# Patient Record
Sex: Male | Born: 1995 | Race: White | Hispanic: No
Health system: Midwestern US, Community
[De-identification: ages and names within clinical notes are randomized; demographics above are authoritative.]

---

## 2014-07-16 IMAGING — CT CT NECK SOFT TISSUE W CONTRAST
3 of 4 series · 16 of 33 positions shown, 19 images · IV contrast (agent unspecified)
Comparison: 11/01/2013.

CT SCAN NECK WITH CONTRAST
HISTORY: Neck mass.
TECHNIQUE: CT of the neck with contrast, 80 cc Optiray 320.

[Series 3: soft tissue · axial · 0.46mm/px · z∈[-276,-84]mm · 8 of 80 slices shown, 10 images]
[im 8/80  soft-tissue]
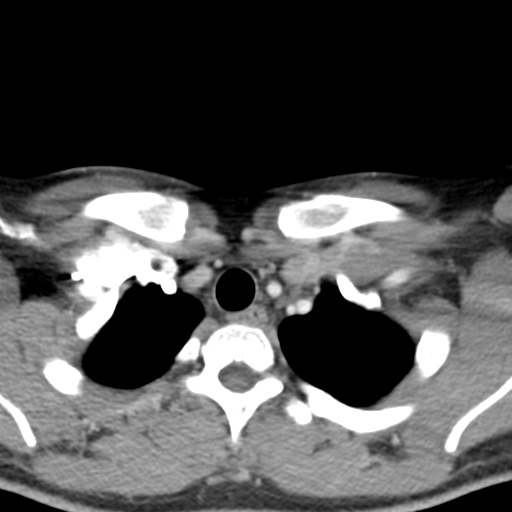
[im 8/80  bone]
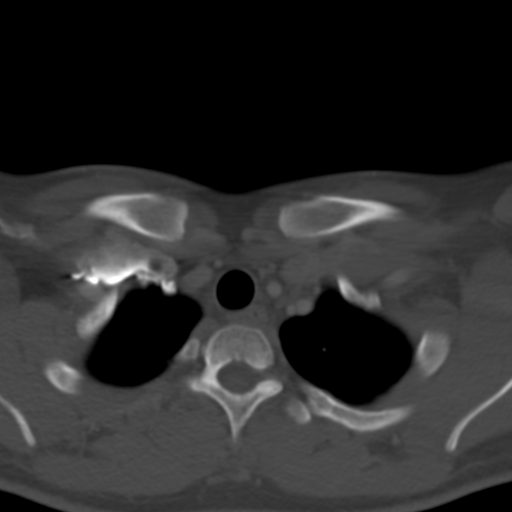
[im 16/80  bone]
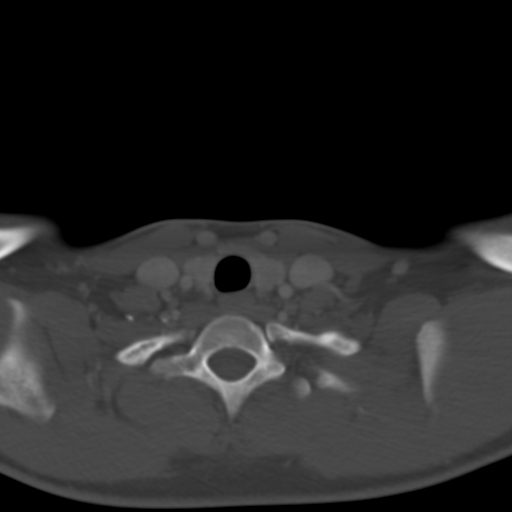
[im 24/80  bone]
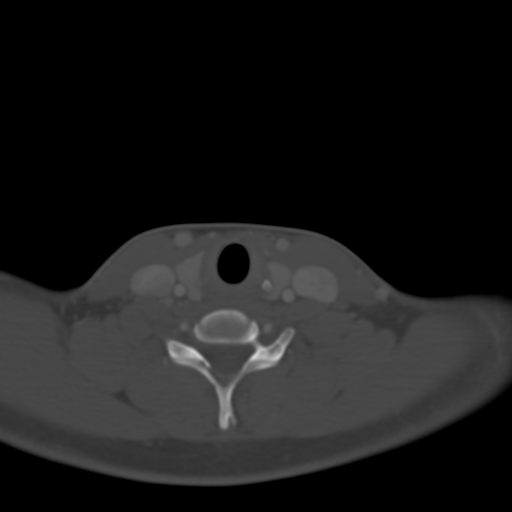
[im 32/80  bone]
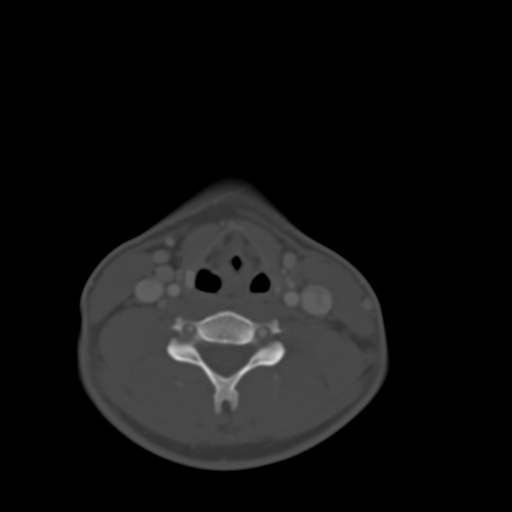
[im 48/80  soft-tissue]
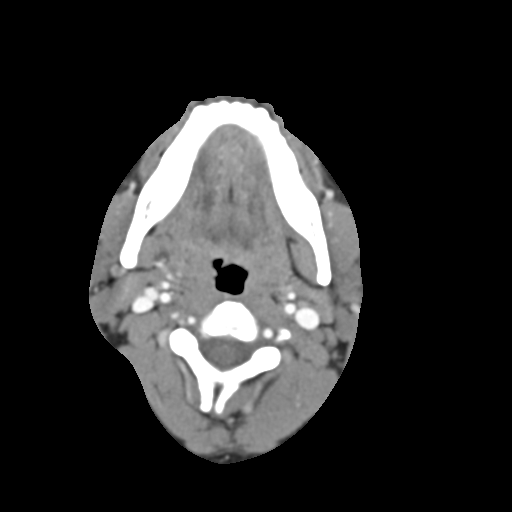
[im 48/80  bone]
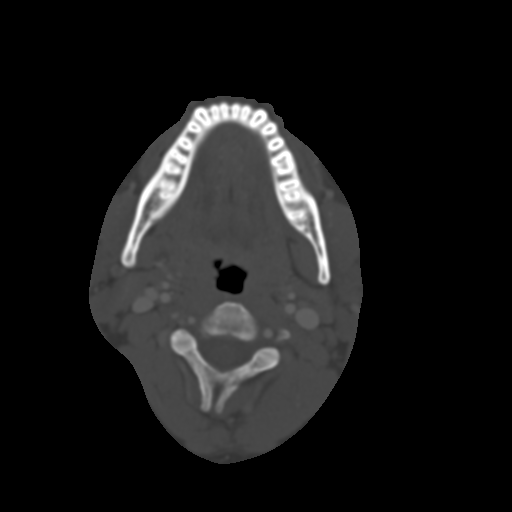
[im 56/80  bone]
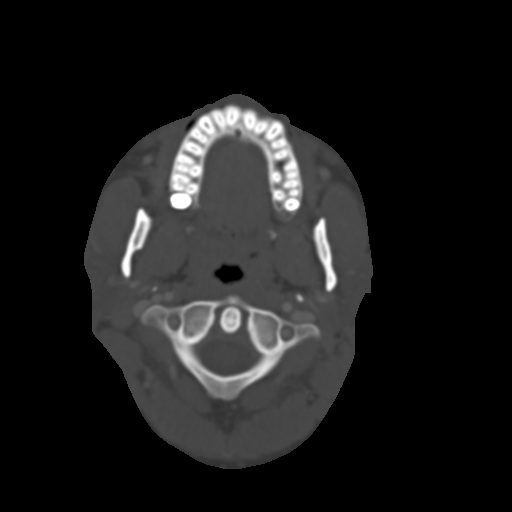
[im 64/80  bone]
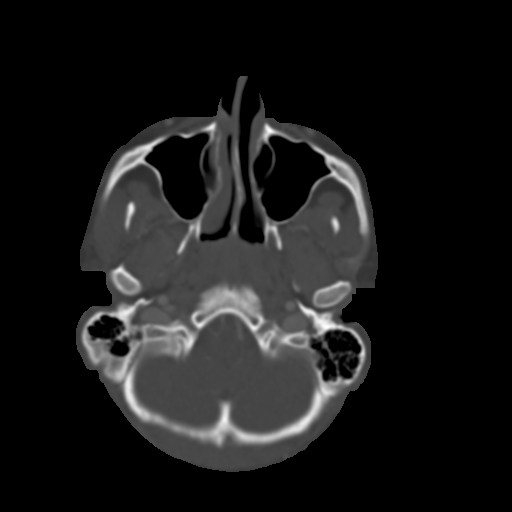
[im 72/80  bone]
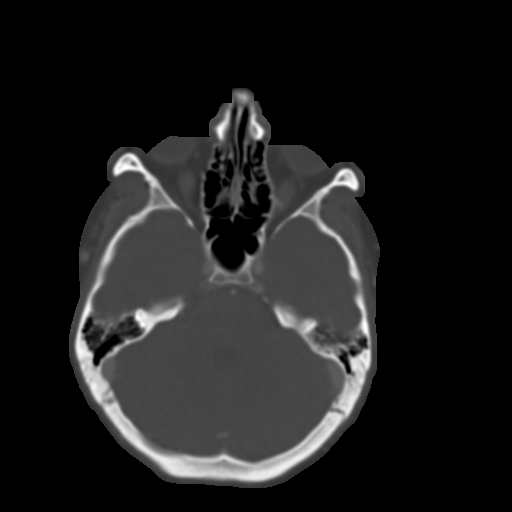

[Series 5: coronal · coronal · 0.50mm/px · 3 of 68 slices shown]
[im 14/68  bone]
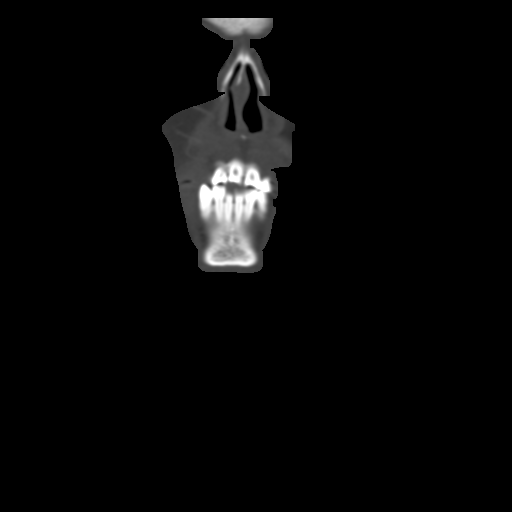
[im 27/68  bone]
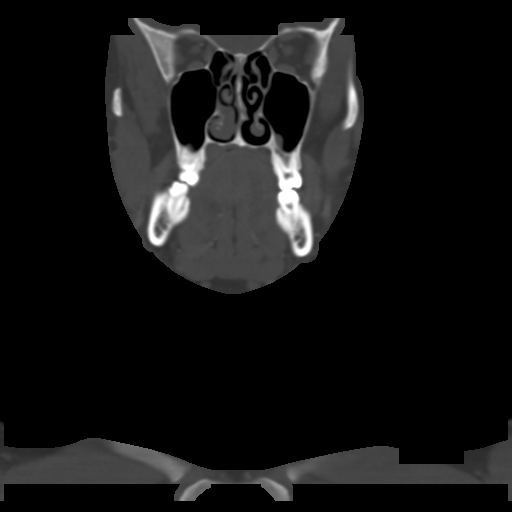
[im 41/68  bone]
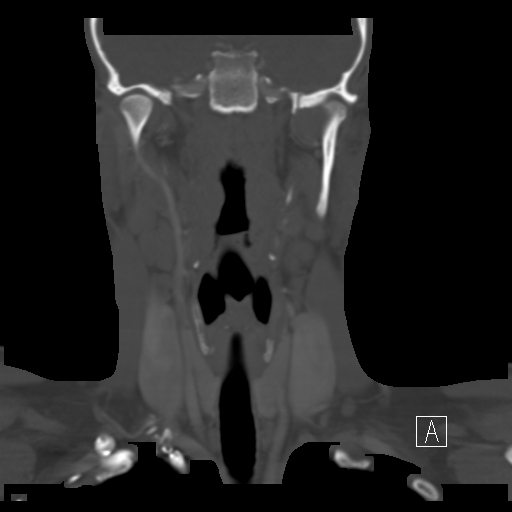

[Series 6: sagittal · sagittal · 0.50mm/px · 5 of 71 slices shown, 6 images]
[im 24/71  bone]
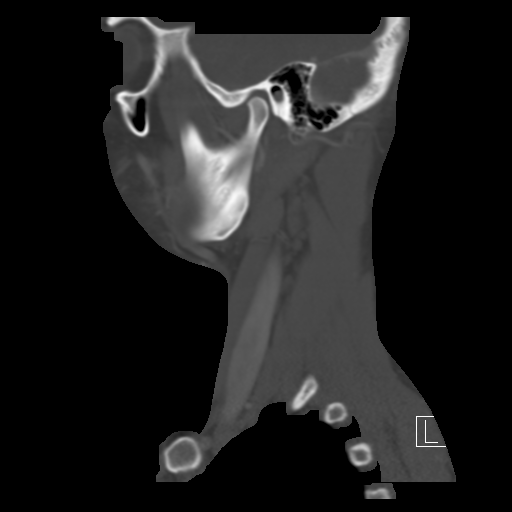
[im 30/71  bone]
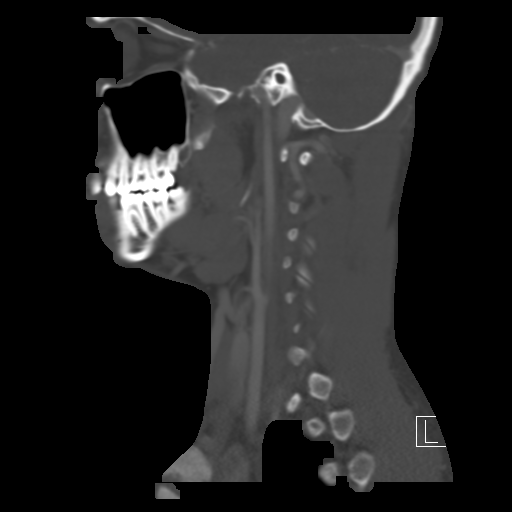
[im 36/71  soft-tissue]
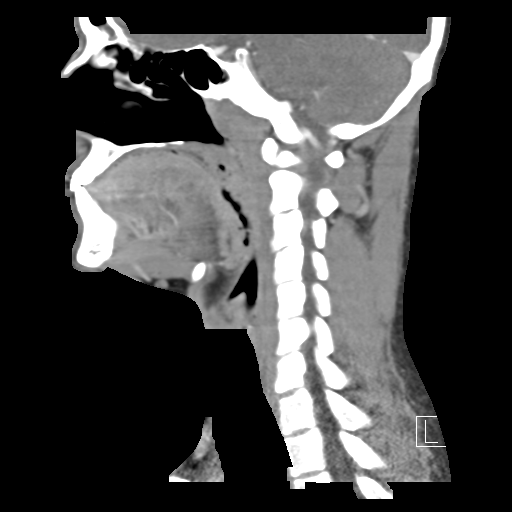
[im 36/71  bone]
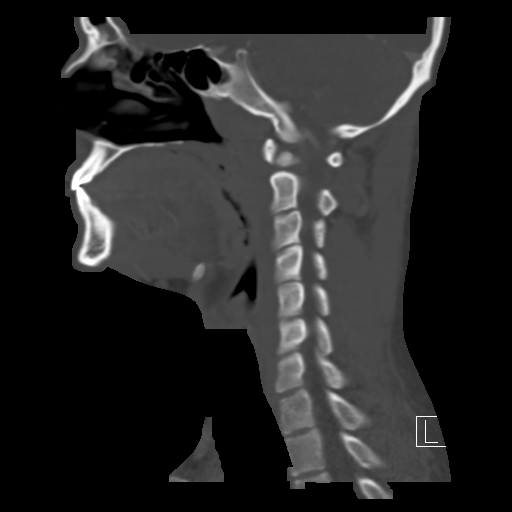
[im 41/71  bone]
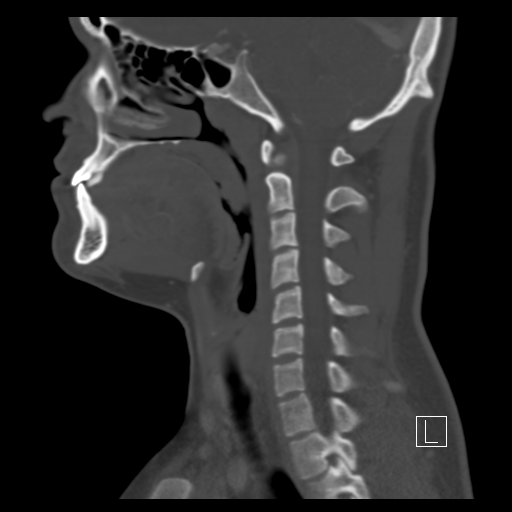
[im 47/71  bone]
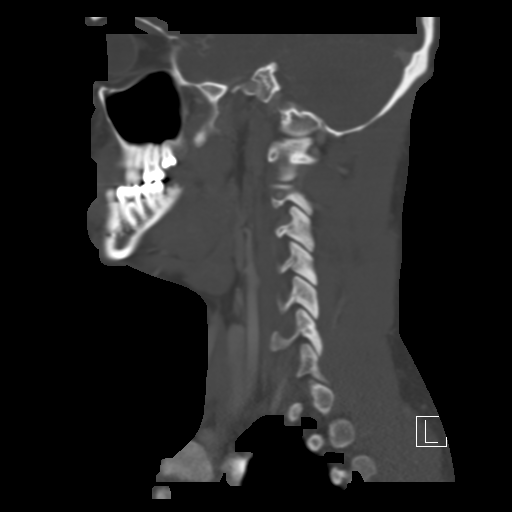

[16 of 33 positions shown; findings below may reference images not displayed]

FINDINGS: Multiple borderline enlarged lymph nodes are seen bilaterally which may not be abnormal in this young patient.

The oropharynx, nasopharynx, hypopharynx, and the laryngeal structures are within normal limits.  There is a 3.3 x 2.8 x 3.1 cm heterogeneous mass in the right submandibular region which is not definitely separate from the right submandibular gland. The thyroid gland, left submandibular gland and parotid glands are unremarkable.

The great vessels of the neck are within normal limits.

The visualized paranasal sinuses are clear.

The cervical vertebral bodies demonstrate no gross abnormalities.
IMPRESSION: Right submandibular mass is nonspecific. Infection or neoplasm (primary submandibular gland versus metastatic versus lymphoma) are considerations.

## 2020-02-28 NOTE — ED Notes (Signed)
ED Triage Note       ED Secondary Triage Entered On:  02/28/2020 14:06 EDT    Performed On:  02/28/2020 14:05 EDT by Leitha Bleak, RN, Lavonne Chick               General Information   Barriers to Learning :   None evident   Languages :   English   COVID-19 Vaccine Status :   1 Dose received   1 Dose Received Date :   02/21/2020 EDT   1 Dose Received Manufacturer :   Pfizer vaccine   ED Home Meds Section :   Document assessment   [facility] ED Fall Risk Section :   Document assessment   ED History Section :   Document assessment   ED Advance Directives Section :   Document assessment   ED Palliative Screen :   N/A (prefilled for <65yo)   Leitha Bleak, RN, Lavonne Chick - 02/28/2020 14:05 EDT   (As Of: 02/28/2020 14:06:40 EDT)   Problems(Active)    Anxiety (SNOMED CT  :45409811 )  Name of Problem:   Anxiety ; Recorder:   Ace, RN, Anderson Malta; Confirmation:   Confirmed ; Classification:   Patient Stated ; Code:   [id number] ; Contributor System:   PowerChart ; Last Updated:   02/28/2020 13:41 EDT ; Life Cycle Date:   02/28/2020 ; Life Cycle Status:   Active ; Vocabulary:   SNOMED CT          Diagnoses(Active)    Psychiatric problem  Date:   02/28/2020 ; Diagnosis Type:   Reason For Visit ; Confirmation:   Complaint of ; Clinical Dx:   Psychiatric problem ; Classification:   Medical ; Clinical Service:   Emergency medicine ; Code:   PNED ; Probability:   0 ; Diagnosis Code:   [id number]             -    Procedure History   (As Of: 02/28/2020 14:06:40 EDT)     [facility] Fall Risk Assessment Tool   Hx of falling last 3 months ED Fall :   No   Patient confused or disoriented ED Fall :   No   Patient intoxicated or sedated ED Fall :   No   Patient impaired gait ED Fall :   No   Use a mobility assistance device ED Fall :   No   Patient altered elimination ED Fall :   No   [facility] ED Fall Score :   0    Leitha Bleak RNLavonne Chick - 02/28/2020 14:05 EDT   ED Advance Directive   Advance Directive :   No   Ewers, RN, Lavonne Chick - 02/28/2020 14:05 EDT    Social History   Social History   (As Of: 02/28/2020 14:06:40 EDT)   Tobacco:        Tobacco use: Never (less than 100 in lifetime).   (Last Updated: 02/28/2020 14:06:12 EDT by Leitha Bleak, RN, Lavonne Chick)          Alcohol:        Current, 1-2 times per month   (Last Updated: 02/28/2020 14:06:17 EDT by Leitha Bleak, RN, Lavonne Chick)          Substance Use:        Denies   (Last Updated: 02/28/2020 14:06:26 EDT by Leitha Bleak, RN, Lavonne Chick)            Med Hx   Medication List   (As Of: 02/28/2020  14:06:40 EDT)   No Known Home Medications     Leitha Bleak, RN, Lavonne Chick - 02/28/2020 14:06:02
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
## 2020-02-28 NOTE — ED Notes (Signed)
 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, works from home, Secondary school teacher Pain Scale :   0 = No pain   [place] Mode of Arrival :   Private vehicle   Infectious Disease Documentation :   Document assessment   Temperature Oral :   37.1 degC(Converted to: 98.8 degF)    Heart Rate Monitored :   94 bpm   Respiratory Rate :   16 br/min   Systolic Blood Pressure :   154 mmHg (HI)    Diastolic Blood Pressure :   107 mmHg (>HHI)    SpO2 :   97 %   Oxygen Therapy :   Room air   Patient presentation :   None of the above   Chief Complaint or Presentation suggest infection :   No   Weight Dosing :   80.1 kg(Converted to: 176 lb 9 oz)    Height :   168 cm(Converted to: 5 ft 6 in)    Body Mass Index Dosing :   28 kg/m2   Ace, RN, Harlene HERO - 02/28/2020 13:39 EDT   DCP GENERIC CODE   Tracking Acuity :   2   Tracking Group :   ED [facility]   Ace, RN, Harlene HERO - 02/28/2020 13:39 EDT   ED General Section :   Document assessment   Pregnancy Status :   N/A   ED Allergies Section :   Document assessment   ED Reason for Visit Section :   Document assessment   Ace, RN, Harlene HERO - 02/28/2020 13:39 EDT   ID Risk Screen Symptoms   Recent Travel History :   No recent travel   Close Contact with COVID-19 ID :   No   Last 14 days COVID-19 ID :   No   TB Symptom Screen :   No symptoms   C. diff Symptom/History ID :   Neither of the above   Ace, RN, Harlene HERO - 02/28/2020 13:39 EDT   Allergies   (As Of: 02/28/2020 13:45:07 EDT)   Allergies (Active)   penicillins  Estimated Onset Date:   Unspecified ; Reactions:   throat swelling ; Created By:   Vona RN, Harlene HERO; Reaction Status:   Active ;  Category:   Drug ; Substance:   penicillins ; Type:   Allergy ; Severity:   Severe ; Updated By:   Vona RN, Harlene HERO; Reviewed Date:   02/28/2020 13:44 EDT      sulfa drugs  Estimated Onset Date:   Unspecified ; Reactions:   throat swelling ; Created By:   Vona RN, Harlene HERO; Reaction Status:   Active ; Category:   Drug ; Substance:   sulfa drugs ; Type:   Allergy ; Severity:   Severe ; Updated By:   Vona OBIE Harlene HERO; Reviewed Date:   02/28/2020 13:44 EDT        Psycho-Social  Last 3 mo, thoughts killing self/others :   Yes   Right click within box for Suspected Abuse policy link. :   None   Feels Safe Where Live :   Yes   ED Behavioral Activity Rating Scale :   4 - Quiet and awake (normal level of activity)   Ace, RN, Harlene HERO - 02/28/2020 13:39 EDT   ED Reason for Visit   (As Of: 02/28/2020 13:45:07 EDT)   Problems(Active)    Anxiety (SNOMED CT  :18866980 )  Name of Problem:   Anxiety ; Recorder:   Ace, RN, Harlene HERO; Confirmation:   Confirmed ; Classification:   Patient Stated ; Code:   [id number] ; Contributor System:   PowerChart ; Last Updated:   02/28/2020 13:41 EDT ; Life Cycle Date:   02/28/2020 ; Life Cycle Status:   Active ; Vocabulary:   SNOMED CT          Diagnoses(Active)    Psychiatric problem  Date:   02/28/2020 ; Diagnosis Type:   Reason For Visit ; Confirmation:   Complaint of ; Clinical Dx:   Psychiatric problem ; Classification:   Medical ; Clinical Service:   Emergency medicine ; Code:   PNED ; Probability:   0 ; Diagnosis Code:   [id number]        CSSRS Screen   CSSRS Screen Wish to be Dead :   Past month, yes   CSSRS Screen Suicidal Thoughts :   Past month, yes   CSSRS Screen Suicide Behavior :   Lifetime, no   Ace, RN, Harlene HERO - 02/28/2020 13:39 EDT

## 2020-02-28 NOTE — ED Notes (Signed)
ED Note-Nursing       ED RN Reassessment Entered On:  02/28/2020 15:58 EDT    Performed On:  02/28/2020 15:57 EDT by Leitha Bleak, RN, Lavonne Chick               ED RN Reassessment   ED RN Progress Note :   [facility] @ 45m they will send link via email and do a virtual visit.    Leitha Bleak, RN, Lavonne Chick - 02/28/2020 15:57 EDT

## 2020-02-28 NOTE — ED Notes (Signed)
ED Patient Education Note     Patient Education Materials Follows:  Behavioral Health     Suicidal Feelings: How to Help Yourself    Suicide is the taking of one's own life. If you feel as though life is getting too tough to handle and are thinking about suicide, get help right away. To get help:     Call your local emergency services (911 in the [place]     Call a suicide hotline to speak with a trained counselor who understands how you are feeling. The following is a list of suicide hotlines in the [place]. For a list of hotlines in [place], visit FindSkins.pl.    ? ?1-800-273-TALK 305-713-7398).    ? ?1-800-SUICIDE 3160394684).    ? ?720-746-5316. This is a hotline for Spanish speakers.    ? ?1-800-799-4TTY 813-033-8091). This is a hotline for TTY users.    ? ?1-866-4-U-TREVOR 251-416-9864). This is a hotline for lesbian, gay, bisexual, transgender, or questioning youth.     Contact a crisis center or a local suicide prevention center. To find a crisis center or suicide prevention center:    ? Call your local hospital, clinic, community service organization, mental health center, social service provider, or health department. Ask for assistance in connecting to a crisis center.    ? Visit BankingRep.com.au for a list of crisis centers in the [place], or visit www.suicideprevention.ca/thinking-about-suicide/find-a-crisis-centre for a list of centers in [place].     Visit the following websites:    ? ?National Suicide Prevention Lifeline: www.suicidepreventionlifeline.org    ? ?Hopeline: www.hopeline.com    ? ?[employer] for Suicide Prevention: PromotionalLoans.co.za    ? ?[employer] (for lesbian, gay, bisexual, transgender, or questioning youth): www.thetrevorproject.org    HOW CAN I HELP MYSELF FEEL BETTER?     Promise yourself that you will not do anything drastic when you have suicidal  feelings. Remember, there is hope. Many people have gotten through suicidal thoughts and feelings, and you will, too. You may have gotten through them before, and this proves that you can get through them again.     Let family, friends, teachers, or counselors know how you are feeling. Try not to isolate yourself from those who care about you. Remember, they will want to help you. Talk with someone every day, even if you do not feel sociable. Face-to-face conversation is best.     Call a mental health professional and see one regularly.     Visit your primary health care provider every year.     Eat a well-balanced diet, and space your meals so you eat regularly.     Get plenty of rest.     Avoid alcohol and drugs, and remove them from your home. They will only make you feel worse.     If you are thinking of taking a lot of medicine, give your medicine to someone who can give it to you one day at a time. If you are on antidepressants and are concerned you will overdose, let your health care provider know so he or she can give you safer medicines. Ask your mental health professional about the possible side effects of any medicines you are taking.     Remove weapons, poisons, knives, and anything else that could harm you from your home.     Try to stick to routines. Follow a schedule every day. Put self-care on your schedule.     Make a list of realistic goals, and cross  them off when you achieve them. Accomplishments give a sense of worth.     Wait until you are feeling better before doing the things you find difficult or unpleasant.     Exercise if you are able. You will feel better if you exercise for even a half hour each day.     Go out in the sun or into nature. This will help you recover from depression faster. If you have a favorite place to walk, go there.     Do the things that have always given you pleasure. Play your favorite music, read a good book, paint a picture, play your favorite instrument, or do  anything else that takes your mind off your depression if it is safe to do.     Keep your living space well lit.     When you are feeling well, write yourself a letter about tips and support that you can read when you are not feeling well.     Remember that life's difficulties can be sorted out with help. Conditions can be treated. You can work on thoughts and strategies that serve you well.    This information is not intended to replace advice given to you by your health care provider. Make sure you discuss any questions you have with your health care provider.    Document Released: 03/27/2003 Document Revised: 10/11/2014 Document Reviewed: 01/15/2014  Elsevier Interactive Patient Education ?2016 [employer].

## 2020-02-28 NOTE — ED Notes (Signed)
ED Patient Summary       ;          [facility]  [address], [place], [place] [address]  [phone]  Discharge Instructions (Patient)  _______________________________________     Alex King  DOB:  [birth year]                   MRN: [id number]                   FIN: [id number]  Reason For Visit: Psychiatric problem; SI  Final Diagnosis: Suicidal ideation     Visit Date: 02/28/2020 13:29:00  Address:   Phone: [phone]     Emergency Department Providers:         Primary Physician:   Debbe Odea         [facility] would like to thank you for allowing [place] to assist you with your healthcare needs. The following includes patient education materials and information regarding your injury/illness.     Follow-up Instructions:  You were seen today on an emergency basis. Please contact your primary care doctor for a follow up appointment. If you received a referral to a specialist doctor, it is important you follow-up as instructed.    It is important that you call your follow-up doctor to schedule and confirm the location of your next appointment. Your doctor may practice at multiple locations. The office location of your follow-up appointment may be different to the one written on your discharge instructions.    If you do not have a primary care doctor, please call [phone] for help in finding a [facility] Provider. For help in finding a specialist doctor, please call [phone].    The [facility] "Ask a Nurse" line in staffed by Registered Nurses and is a free service to the community. We are available Monday - Friday from 8am to 5pm to answer your questions about your health. Please call [phone].    If your condition gets worse before your follow-up with your primary care doctor or specialist, please return to the Emergency Department.        Coronavirus 2019 (COVID-19) Reminders:     Patients aged 33 and older,  people with increased risk for severe COVID-19 disease, or frontline workers with increased occupational risk can make an appointment for a COVID-19 vaccine. Patients can contact their [facility] doctors' offices to schedule an appointment to receive the COVID-19 vaccine at the [facility] or send [place] an email at [email or web address]. Patients who do not have a [facility] physician can call [phone] to schedule vaccination appointments.            Scan this code with your phone camera to send an email to the address above.          Follow Up Appointments:  Primary Care Provider:      Name: PCP,  NONE      Phone:                  With: Address: When:   [facility]  Jun/10/2019 10:00 AM   Comments:   This will be a virtual appointment with a psychiatric provider. Please call the clinic with any questions [phone]              Printed Prescriptions:    Patient Education Materials:  Discharge Orders  Discharge Patient 02/28/20 15:45:00 EDT         Comment:      Suicidal Feelings: How to Help Yourself     Suicidal Feelings: How to Help Yourself    Suicide is the taking of one's own life. If you feel as though life is getting too tough to handle and are thinking about suicide, get help right away. To get help:     Call your local emergency services (911 in the [place]     Call a suicide hotline to speak with a trained counselor who understands how you are feeling. The following is a list of suicide hotlines in the [place]. For a list of hotlines in [place], visit [employer].it.    ? ?1-800-273-TALK 416-319-8719).    ? ?1-800-SUICIDE (712)466-4492).    ? ?639-580-0570. This is a hotline for Spanish speakers.    ? ?1-800-799-4TTY 701-306-1369). This is a hotline for TTY users.    ? ?1-866-4-U-TREVOR 412-666-0989). This is a hotline for lesbian, gay, bisexual, transgender,  or questioning youth.     Contact a crisis center or a local suicide prevention center. To find a crisis center or suicide prevention center:    ? Call your local hospital, clinic, community service organization, mental health center, social service provider, or health department. Ask for assistance in connecting to a crisis center.    ? Visit https://www.patel-king.com/ for a list of crisis centers in the [place], or visit www.suicideprevention.ca/thinking-about-suicide/find-a-crisis-centre for a list of centers in [place].     Visit the following websites:    ? ?National Suicide Prevention Lifeline: www.suicidepreventionlifeline.org    ? ?Hopeline: www.hopeline.com    ? ?[employer] for Suicide Prevention: https://www.ayers.com/    ? ?[employer] (for lesbian, gay, bisexual, transgender, or questioning youth): www.thetrevorproject.org    HOW CAN I HELP MYSELF FEEL BETTER?     Promise yourself that you will not do anything drastic when you have suicidal feelings. Remember, there is hope. Many people have gotten through suicidal thoughts and feelings, and you will, too. You may have gotten through them before, and this proves that you can get through them again.     Let family, friends, teachers, or counselors know how you are feeling. Try not to isolate yourself from those who care about you. Remember, they will want to help you. Talk with someone every day, even if you do not feel sociable. Face-to-face conversation is best.     Call a mental health professional and see one regularly.     Visit your primary health care provider every year.     Eat a well-balanced diet, and space your meals so you eat regularly.     Get plenty of rest.     Avoid alcohol and drugs, and remove them from your home. They will only make you feel worse.     If you are thinking of taking a lot of medicine, give your medicine to someone who can give it to you one day at a time. If you are on  antidepressants and are concerned you will overdose, let your health care provider know so he or she can give you safer medicines. Ask your mental health professional about the possible side effects of any medicines you are taking.     Remove weapons, poisons, knives, and anything else that could harm you from your home.     Try to stick to routines. Follow a schedule every day. Put self-care on your schedule.  Make a list of realistic goals, and cross them off when you achieve them. Accomplishments give a sense of worth.     Wait until you are feeling better before doing the things you find difficult or unpleasant.     Exercise if you are able. You will feel better if you exercise for even a half hour each day.     Go out in the sun or into nature. This will help you recover from depression faster. If you have a favorite place to walk, go there.     Do the things that have always given you pleasure. Play your favorite music, read a good book, paint a picture, play your favorite instrument, or do anything else that takes your mind off your depression if it is safe to do.     Keep your living space well lit.     When you are feeling well, write yourself a letter about tips and support that you can read when you are not feeling well.     Remember that life's difficulties can be sorted out with help. Conditions can be treated. You can work on thoughts and strategies that serve you well.    This information is not intended to replace advice given to you by your health care provider. Make sure you discuss any questions you have with your health care provider.    Document Released: 03/27/2003 Document Revised: 10/11/2014 Document Reviewed: 01/15/2014  Elsevier Interactive Patient Education ?2016 Elsevier Inc.         Allergy Info: sulfa drugs; penicillins     Medication Information:  [facility] ED Physicians provided you with a complete list of medications post discharge, if you have been  instructed to stop taking a medication please ensure you also follow up with this information to your Primary Care Physician.  Unless otherwise noted, patient will continue to take medications as prescribed prior to the Emergency Room visit.  Any specific questions regarding your chronic medications and dosages should be discussed with your physician(s) and pharmacist.          sertraline (Zoloft 50 mg oral tablet) 1 Tabs Oral (given by mouth) Once a Day (at bedtime). Refills: 0.      Medications Administered During Visit:       Major Tests and Procedures:  The following procedures and tests were performed during your ED visit.  PROCEDURES%>  PROCEDURES COMMENTS%>          Laboratory Orders  No laboratory orders were placed.              Radiology Orders  No radiology orders were placed.              Patient Care Orders  Name Status Details   Communication to Nursing Completed 02/28/20 13:45:08 EDT, NOT VALID FOR pharmacy, laboratory, radiology., 02/28/20 13:45:08 EDT   Discharge Patient Ordered 02/28/20 15:45:00 EDT   ED Assessment Adult Completed 02/28/20 13:45:08 EDT, 02/28/20 13:45:08 EDT   ED Secondary Triage Completed 02/28/20 13:45:08 EDT, 02/28/20 13:45:08 EDT   ED Triage Adult Completed 02/28/20 13:29:52 EDT, 02/28/20 13:29:52 EDT       ---------------------------------------------------------------------------------------------------------------------  [facility]) encourages you to self-enroll in the [facility] Patient Portal.  [facility] Patient Portal will allow you to manage your personal health information securely from your own electronic device now and in the future.  To begin your Patient Portal enrollment process, please visit [email or web address] Click on "Sign up  now" under [facility].  If you find that you need additional assistance on the [facility] Patient Portal or need a copy of your medical records, please call the [facility] Medical Records  Office at [phone].  Comment:

## 2020-02-28 NOTE — Discharge Summary (Signed)
 ED Clinical Summary                         [facility]  [address]  [place], [place], [address]  [phone]           PERSON INFORMATION  Name: Alex King, Alex King Age:  24 Years DOB: [birth year]   Sex: Male Language:  PCP: PCP,  NONE   Marital Status:   Phone: [phone] Med Service: MED-Medicine   MRN:  [id number] Acct# [id number] Arrival: 02/28/2020 13:29:00   Visit Reason: Psychiatric problem; SI Acuity: 2 LOS: 000 02:38   Address:        Diagnosis:      Suicidal ideation  Printed Prescriptions:            Allergies      penicillins (throat swelling)      sulfa drugs (throat swelling)      Medications Administered During Visit:        Patient Medication List:              sertraline (Zoloft 50 mg oral tablet) 1 Tabs Oral (given by mouth) Once a Day (at bedtime). Refills: 0.         Major Tests and Procedures:  The following procedures and tests were performed during your ED visit.  COMMONPROCEDURES%>  COMMON PROCEDURESCOMMENTS%>          Laboratory Orders  No laboratory orders were placed.              Radiology Orders  No radiology orders were placed.              Patient Care Orders  Name Status Details   Communication to Nursing Completed 02/28/20 13:45:08 EDT, NOT VALID FOR pharmacy, laboratory, radiology., 02/28/20 13:45:08 EDT   Discharge Patient Ordered 02/28/20 15:45:00 EDT   ED Assessment Adult Completed 02/28/20 13:45:08 EDT, 02/28/20 13:45:08 EDT   ED Secondary Triage Completed 02/28/20 13:45:08 EDT, 02/28/20 13:45:08 EDT   ED Triage Adult Completed 02/28/20 13:29:52 EDT, 02/28/20 13:29:52 EDT             PROVIDER INFORMATION               Provider Role Assigned Sampson More, RN, Darryle HERO ED Nurse 02/28/2020 13:56:55    SMITTY ART ED Provider 02/28/2020 14:02:07        Attending Physician:  DEFAULT,  DOCTOR     Admit Doc  DEFAULT,  DOCTOR     Consulting Doc  ANDREY POTTER A-MD     VITALS INFORMATION  Vital Sign Triage Latest   Temp Oral  ORAL_1%>37.1 degC ORAL%>37.1 degC   Temp Temporal TEMPORAL_1%> TEMPORAL%>   Temp Intravascular INTRAVASCULAR_1%> INTRAVASCULAR%>   Temp Axillary AXILLARY_1%> AXILLARY%>   Temp Rectal RECTAL_1%> RECTAL%>   02 Sat 97 % 98 %   Respiratory Rate RATE_1%>16 br/min RATE%>16 br/min   Peripheral Pulse Rate PULSE RATE_1%>74 bpm PULSE RATE%>67 bpm   Apical Heart Rate HEART RATE_1%> HEART RATE%>   Blood Pressure BLOOD PRESSURE_1%>/ BLOOD PRESSURE_1%>107 mmHg BLOOD PRESSURE%>124 mmHg / BLOOD PRESSURE%>78 mmHg                 Immunizations      No Immunizations Documented This Visit          DISCHARGE INFORMATION   Discharge Disposition: H Outpt-Sent Home   Discharge Location:    Home   Discharge Date and  Time:    02/28/2020 16:07:10   ED Checkout Date and Time:    02/28/2020 16:07:10     DEPART REASON INCOMPLETE INFORMATION               Depart Action Incomplete Reason   Interactive View/I&O Recently assessed               Problems      No Problems Documented              Smoking Status      Never (less than 100 in lifetime)         PATIENT EDUCATION INFORMATION  Instructions:       Suicidal Feelings: How to Help Yourself     Follow up:                    With: Address: When:   [facility]  Jun/10/2019 10:00 AM   Comments:   This will be a virtual appointment with a psychiatric provider. Please call the clinic with any questions [phone]           ED PROVIDER DOCUMENTATION     Patient:   Alex King, Alex King             MRN: [id number]            FIN: [id number]               Age:   24 years     Sex:  Male     DOB:  [birth year]   Associated Diagnoses:   Suicidal ideation   Author:   SMITTY ART      Basic Information   Additional information: Chief Complaint from Nursing Triage Note   Chief Complaint  Chief Complaint:  its just a bad day has not seen [facility], got out of military in 2018, pt has has thought so f wantign to die but no plan , spouse is good support system, denies any street  drugs, works from home, accountant (02/28/20 13:39:00).      History of Present Illness   The patient presents with This is a 24 year old male who got an argument with his wife this morning of her vaping.  And he had episode of wanting to hurt himself.  He states that he thought that maybe he could drive his car into a tree on the way to the dentist and not hurt anybody else.  His wife and him talk about it and she told her that she left and was there for him.  And these thoughts went away.  And he actually went to the dentist and then came back and as of the original conversation, the wife got worried and brought him to the emergency room.  Currently he states he is not suicidal or homicidal.  He has a history of depression in the past and has been on medications but has not seen his therapist at the [facility] in a long time because he was able to work things out.  He is employed, as an Airline pilot.  On no medications and no medical problems.  Never committed and no previous suicidal ideation..        Review of Systems   Constitutional symptoms:  No fever, no chills.    Eye symptoms:  Vision unchanged, No diplopia,    ENMT symptoms:  No ear pain, no sore throat.    Respiratory symptoms:  No shortness of breath,  no cough.    Cardiovascular symptoms:  No chest pain, no palpitations.    Gastrointestinal symptoms:  No vomiting, no diarrhea.    Genitourinary symptoms:  No dysuria, no hematuria.    Neurologic symptoms:  No numbness, no tingling, no focal weakness.              Additional review of systems information: All other systems reviewed and otherwise negative.      Health Status   Allergies:    Allergic Reactions (Selected)  Severe  Penicillins- Throat swelling.  Sulfa drugs- Throat swelling..      Past Medical/ Family/ Social History   Medical history: Reviewed as documented in chart.   Surgical history: Reviewed as documented in chart.   Family history: Not significant.   Social history: Reviewed as documented in  chart.   Problem list:    Active Problems (1)  Anxiety   .      Physical Examination               Vital Signs   Vital Signs   02/28/2020 13:39 EDT Systolic Blood Pressure 154 mmHg  HI    Diastolic Blood Pressure 107 mmHg  >HHI    Temperature Oral 37.1 degC    Heart Rate Monitored 94 bpm    Respiratory Rate 16 br/min    SpO2 97 %   .   Measurements   02/28/2020 13:45 EDT Body Mass Index est meas 28.38 kg/m2    Body Mass Index Measured 28.38 kg/m2   02/28/2020 13:39 EDT Height/Length Measured 168 cm    Weight Dosing 80.1 kg   .   Basic Oxygen Information   02/28/2020 13:39 EDT Oxygen Therapy Room air    SpO2 97 %   .   General:  Alert, no acute distress, Patient has normal vital signs, he is completely awake alert and conversant.  He is normal affect and is currently nonsuicidal.  He is able to joke..    Skin:  Warm, dry, no rash.    Head:  Atraumatic.   Neck:  Trachea midline.   Eye:  Normal conjunctiva.   Cardiovascular:  Regular rate and rhythm, Normal peripheral perfusion, No edema.    Respiratory:  Respirations are non-labored.   Back:  Normal range of motion.   Musculoskeletal:  Normal strength, no deformity.    Neurological:  Alert and oriented to person, place, time, and situation, No focal neurological deficit observed.    Psychiatric:  Cooperative, appropriate mood & affect, normal judgment, non-suicidal.       Reexamination/ Reevaluation   Time: 02/28/2020 15:41:00 .   Vital signs   Basic Oxygen Information   02/28/2020 13:39 EDT Oxygen Therapy Room air    SpO2 97 %      Notes: Annitta Hone, psychiatrist interviewed and saw the patient via the telehealth.  And agrees with my assessment.  She was started on Zoloft, 50 mg a day at night.  And she is arranging with the [facility] to get him an appointment as he is already associated with the [facility] system..      Impression and Plan   Diagnosis   Suicidal ideation (ICD10-CM R45.851, Discharge, Medical)      Calls-Consults   -  HONE POTTER A-MD.   Plan    Condition: Improved.    Disposition: Discharged: to home.    Prescriptions: Launch prescriptions   Pharmacy:  Zoloft 50 mg oral tablet (Prescribe): 50 mg, 1 tabs, Oral,  Once a Day (at bedtime), 7 tabs, 0 Refill(s).    Patient was given the following educational materials: Suicidal Feelings: How to Help Yourself.    Follow up with: With [facility] as schedueled Within 1 week Return to ED if symptoms worsen.    Counseled: Patient, Family, Regarding treatment plan, Regarding prescription.
